# Patient Record
Sex: Male | Born: 2000 | Race: White | Hispanic: No | Marital: Single | Smoking: Never smoker
Health system: Southern US, Community
[De-identification: ages and names within clinical notes are randomized; demographics above are authoritative.]

## PROBLEM LIST (undated history)

## (undated) DIAGNOSIS — R109 Unspecified abdominal pain: Secondary | ICD-10-CM

## (undated) HISTORY — DX: Unspecified abdominal pain: R10.9

## (undated) HISTORY — PX: TONSILLECTOMY: SUR1361

---
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
## 2020-05-03 DIAGNOSIS — Z419 Encounter for procedure for purposes other than remedying health state, unspecified: Secondary | ICD-10-CM | POA: Diagnosis not present

## 2020-06-03 DIAGNOSIS — Z419 Encounter for procedure for purposes other than remedying health state, unspecified: Secondary | ICD-10-CM | POA: Diagnosis not present

## 2020-07-04 DIAGNOSIS — Z419 Encounter for procedure for purposes other than remedying health state, unspecified: Secondary | ICD-10-CM | POA: Diagnosis not present

## 2020-08-03 DIAGNOSIS — Z419 Encounter for procedure for purposes other than remedying health state, unspecified: Secondary | ICD-10-CM | POA: Diagnosis not present

## 2020-09-03 DIAGNOSIS — Z419 Encounter for procedure for purposes other than remedying health state, unspecified: Secondary | ICD-10-CM | POA: Diagnosis not present

## 2020-10-03 DIAGNOSIS — Z419 Encounter for procedure for purposes other than remedying health state, unspecified: Secondary | ICD-10-CM | POA: Diagnosis not present

## 2020-10-22 DIAGNOSIS — M222X1 Patellofemoral disorders, right knee: Secondary | ICD-10-CM | POA: Diagnosis not present

## 2020-11-03 DIAGNOSIS — Z419 Encounter for procedure for purposes other than remedying health state, unspecified: Secondary | ICD-10-CM | POA: Diagnosis not present

## 2020-12-04 DIAGNOSIS — Z419 Encounter for procedure for purposes other than remedying health state, unspecified: Secondary | ICD-10-CM | POA: Diagnosis not present

## 2021-01-01 DIAGNOSIS — Z419 Encounter for procedure for purposes other than remedying health state, unspecified: Secondary | ICD-10-CM | POA: Diagnosis not present

## 2021-02-01 DIAGNOSIS — Z419 Encounter for procedure for purposes other than remedying health state, unspecified: Secondary | ICD-10-CM | POA: Diagnosis not present

## 2021-02-15 DIAGNOSIS — J1089 Influenza due to other identified influenza virus with other manifestations: Secondary | ICD-10-CM | POA: Diagnosis not present

## 2021-03-03 DIAGNOSIS — Z419 Encounter for procedure for purposes other than remedying health state, unspecified: Secondary | ICD-10-CM | POA: Diagnosis not present

## 2021-04-03 DIAGNOSIS — Z419 Encounter for procedure for purposes other than remedying health state, unspecified: Secondary | ICD-10-CM | POA: Diagnosis not present

## 2021-05-03 DIAGNOSIS — Z419 Encounter for procedure for purposes other than remedying health state, unspecified: Secondary | ICD-10-CM | POA: Diagnosis not present

## 2021-06-03 DIAGNOSIS — Z419 Encounter for procedure for purposes other than remedying health state, unspecified: Secondary | ICD-10-CM | POA: Diagnosis not present

## 2021-07-04 DIAGNOSIS — Z419 Encounter for procedure for purposes other than remedying health state, unspecified: Secondary | ICD-10-CM | POA: Diagnosis not present

## 2021-08-03 DIAGNOSIS — Z419 Encounter for procedure for purposes other than remedying health state, unspecified: Secondary | ICD-10-CM | POA: Diagnosis not present

## 2021-09-03 DIAGNOSIS — Z419 Encounter for procedure for purposes other than remedying health state, unspecified: Secondary | ICD-10-CM | POA: Diagnosis not present

## 2021-10-03 DIAGNOSIS — Z419 Encounter for procedure for purposes other than remedying health state, unspecified: Secondary | ICD-10-CM | POA: Diagnosis not present

## 2021-11-03 DIAGNOSIS — Z419 Encounter for procedure for purposes other than remedying health state, unspecified: Secondary | ICD-10-CM | POA: Diagnosis not present

## 2021-12-04 DIAGNOSIS — Z419 Encounter for procedure for purposes other than remedying health state, unspecified: Secondary | ICD-10-CM | POA: Diagnosis not present

## 2022-01-01 DIAGNOSIS — Z419 Encounter for procedure for purposes other than remedying health state, unspecified: Secondary | ICD-10-CM | POA: Diagnosis not present

## 2022-02-01 DIAGNOSIS — Z419 Encounter for procedure for purposes other than remedying health state, unspecified: Secondary | ICD-10-CM | POA: Diagnosis not present

## 2022-03-03 DIAGNOSIS — Z419 Encounter for procedure for purposes other than remedying health state, unspecified: Secondary | ICD-10-CM | POA: Diagnosis not present

## 2022-04-03 DIAGNOSIS — Z419 Encounter for procedure for purposes other than remedying health state, unspecified: Secondary | ICD-10-CM | POA: Diagnosis not present

## 2022-05-03 DIAGNOSIS — Z419 Encounter for procedure for purposes other than remedying health state, unspecified: Secondary | ICD-10-CM | POA: Diagnosis not present

## 2022-06-03 DIAGNOSIS — Z419 Encounter for procedure for purposes other than remedying health state, unspecified: Secondary | ICD-10-CM | POA: Diagnosis not present

## 2022-07-04 DIAGNOSIS — Z419 Encounter for procedure for purposes other than remedying health state, unspecified: Secondary | ICD-10-CM | POA: Diagnosis not present

## 2022-08-03 DIAGNOSIS — Z419 Encounter for procedure for purposes other than remedying health state, unspecified: Secondary | ICD-10-CM | POA: Diagnosis not present

## 2022-09-03 DIAGNOSIS — Z419 Encounter for procedure for purposes other than remedying health state, unspecified: Secondary | ICD-10-CM | POA: Diagnosis not present

## 2022-10-03 DIAGNOSIS — Z419 Encounter for procedure for purposes other than remedying health state, unspecified: Secondary | ICD-10-CM | POA: Diagnosis not present

## 2022-11-03 DIAGNOSIS — Z419 Encounter for procedure for purposes other than remedying health state, unspecified: Secondary | ICD-10-CM | POA: Diagnosis not present

## 2022-12-04 DIAGNOSIS — Z419 Encounter for procedure for purposes other than remedying health state, unspecified: Secondary | ICD-10-CM | POA: Diagnosis not present

## 2023-01-02 DIAGNOSIS — Z419 Encounter for procedure for purposes other than remedying health state, unspecified: Secondary | ICD-10-CM | POA: Diagnosis not present

## 2023-02-02 DIAGNOSIS — Z419 Encounter for procedure for purposes other than remedying health state, unspecified: Secondary | ICD-10-CM | POA: Diagnosis not present

## 2023-03-04 DIAGNOSIS — Z419 Encounter for procedure for purposes other than remedying health state, unspecified: Secondary | ICD-10-CM | POA: Diagnosis not present

## 2023-04-04 DIAGNOSIS — Z419 Encounter for procedure for purposes other than remedying health state, unspecified: Secondary | ICD-10-CM | POA: Diagnosis not present

## 2023-05-04 DIAGNOSIS — Z419 Encounter for procedure for purposes other than remedying health state, unspecified: Secondary | ICD-10-CM | POA: Diagnosis not present

## 2023-06-04 DIAGNOSIS — Z419 Encounter for procedure for purposes other than remedying health state, unspecified: Secondary | ICD-10-CM | POA: Diagnosis not present

## 2023-07-05 DIAGNOSIS — Z419 Encounter for procedure for purposes other than remedying health state, unspecified: Secondary | ICD-10-CM | POA: Diagnosis not present

## 2023-08-04 DIAGNOSIS — Z419 Encounter for procedure for purposes other than remedying health state, unspecified: Secondary | ICD-10-CM | POA: Diagnosis not present

## 2023-09-04 DIAGNOSIS — Z419 Encounter for procedure for purposes other than remedying health state, unspecified: Secondary | ICD-10-CM | POA: Diagnosis not present

## 2023-10-04 DIAGNOSIS — Z419 Encounter for procedure for purposes other than remedying health state, unspecified: Secondary | ICD-10-CM | POA: Diagnosis not present

## 2023-11-04 DIAGNOSIS — Z419 Encounter for procedure for purposes other than remedying health state, unspecified: Secondary | ICD-10-CM | POA: Diagnosis not present

## 2023-12-05 DIAGNOSIS — Z419 Encounter for procedure for purposes other than remedying health state, unspecified: Secondary | ICD-10-CM | POA: Diagnosis not present

## 2024-01-02 DIAGNOSIS — Z419 Encounter for procedure for purposes other than remedying health state, unspecified: Secondary | ICD-10-CM | POA: Diagnosis not present

## 2024-02-13 DIAGNOSIS — Z419 Encounter for procedure for purposes other than remedying health state, unspecified: Secondary | ICD-10-CM | POA: Diagnosis not present

## 2024-03-14 DIAGNOSIS — Z419 Encounter for procedure for purposes other than remedying health state, unspecified: Secondary | ICD-10-CM | POA: Diagnosis not present

## 2024-04-14 DIAGNOSIS — Z419 Encounter for procedure for purposes other than remedying health state, unspecified: Secondary | ICD-10-CM | POA: Diagnosis not present

## 2024-05-14 DIAGNOSIS — Z419 Encounter for procedure for purposes other than remedying health state, unspecified: Secondary | ICD-10-CM | POA: Diagnosis not present

## 2024-06-14 DIAGNOSIS — Z419 Encounter for procedure for purposes other than remedying health state, unspecified: Secondary | ICD-10-CM | POA: Diagnosis not present

## 2024-07-15 DIAGNOSIS — Z419 Encounter for procedure for purposes other than remedying health state, unspecified: Secondary | ICD-10-CM | POA: Diagnosis not present

## 2024-08-14 DIAGNOSIS — Z419 Encounter for procedure for purposes other than remedying health state, unspecified: Secondary | ICD-10-CM | POA: Diagnosis not present
